# Patient Record
Sex: Male | Born: 2000 | Race: White | Hispanic: No | Marital: Single | Smoking: Never smoker
Health system: Southern US, Community
[De-identification: ages and names within clinical notes are randomized; demographics above are authoritative.]

---
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
## 2020-02-18 ENCOUNTER — Telehealth: Payer: Self-pay | Admitting: Physician Assistant

## 2020-02-18 ENCOUNTER — Other Ambulatory Visit: Payer: Self-pay

## 2020-02-18 DIAGNOSIS — R748 Abnormal levels of other serum enzymes: Secondary | ICD-10-CM

## 2020-02-18 NOTE — Telephone Encounter (Signed)
Thomasene Lot, DO  10/29/2019 12:24 PM EST    Please call patient let him know that his vitamin D is too low and I recommend he take 2000 IUs vitamin D3 over-the-counter daily.  Also, his liver enzymes are high and to my knowledge patient does not drink alcohol, take Tylenol on a regular basis or any other supplements that I know of.  Please let them know I recommend a recheck of this after 2 to 3 months of steering clear of all hepatotoxic substances.  If it anytime he has unusual symptoms, he needs to make a follow-up office visit with me to discuss.    Actually, the pt is to return for yearly physical 09/2020.  Pt needs repeat labs only, unless he is having any problems, especially with GI problems such as abdominal pain, nausea or vomiting, blood in stool, etc.  Orders entered for labs.  Tiajuana Amass, CMA

## 2020-02-18 NOTE — Telephone Encounter (Signed)
Pt's dad ask when are we going to bring pt back in for OV & labs ( last cancelled appt shows :  IN OFFICE APPT--OV to discuss elevated liver enzyme level- & 2nd HPV shot----but AVS from  2/11 OV states  CPE w/ FBW---- also there are no labs orders in pt's chart)  --Forwarding message to med asst for review w/provider on what visit type to scheduled pt for & I will contact parent to set up.  --please advise.  --glh

## 2020-02-28 ENCOUNTER — Other Ambulatory Visit: Payer: Self-pay

## 2020-02-28 ENCOUNTER — Other Ambulatory Visit: Payer: 59

## 2020-02-28 DIAGNOSIS — R748 Abnormal levels of other serum enzymes: Secondary | ICD-10-CM

## 2020-02-29 LAB — HEPATIC FUNCTION PANEL
ALT: 37 IU/L (ref 0–44)
AST: 24 IU/L (ref 0–40)
Albumin: 4.9 g/dL (ref 4.1–5.2)
Alkaline Phosphatase: 124 IU/L (ref 55–125)
Bilirubin Total: 0.9 mg/dL (ref 0.0–1.2)
Bilirubin, Direct: 0.18 mg/dL (ref 0.00–0.40)
Total Protein: 7.5 g/dL (ref 6.0–8.5)

## 2020-03-02 ENCOUNTER — Telehealth: Payer: Self-pay | Admitting: Physician Assistant

## 2020-03-02 NOTE — Telephone Encounter (Signed)
Patients father, Daelen Belvedere, in office requesting last lab results for patient. Arlys John is on DPR. AS, CMA

## 2020-03-10 ENCOUNTER — Ambulatory Visit (INDEPENDENT_AMBULATORY_CARE_PROVIDER_SITE_OTHER): Payer: 59 | Admitting: Physician Assistant

## 2020-03-10 ENCOUNTER — Encounter: Payer: Self-pay | Admitting: Physician Assistant

## 2020-03-10 ENCOUNTER — Other Ambulatory Visit: Payer: Self-pay

## 2020-03-10 VITALS — BP 131/64 | HR 83 | Temp 97.6°F | Ht 71.0 in | Wt 188.8 lb

## 2020-03-10 DIAGNOSIS — R748 Abnormal levels of other serum enzymes: Secondary | ICD-10-CM

## 2020-03-10 DIAGNOSIS — Z23 Encounter for immunization: Secondary | ICD-10-CM | POA: Diagnosis not present

## 2020-03-10 NOTE — Progress Notes (Signed)
Established Patient Office Visit  Subjective:  Patient ID: Jermaine Schmidt, male    DOB: 2001/01/10  Age: 19 y.o. MRN: [id number]  CC: No chief complaint on file.   HPI Jermaine Schmidt presents for follow-up on elevated liver enzymes. Most recent hepatic function resulted within normal limits. Patient states he was taking protein shakes which he stopped for about a week and then resumed, so doesn't think that was the cause since his liver enzymes normalized. Denies excessive Tylenol use, alcohol use, nausea, vomiting or abdominal pain. He is also due for his second HPV vaccine dose.  History reviewed. No pertinent past medical history.  History reviewed. No pertinent surgical history.  Family History  Problem Relation Age of Onset  . Breast cancer Paternal Grandmother     Social History   Socioeconomic History  . Marital status: Single    Spouse name: Not on file  . Number of children: Not on file  . Years of education: Not on file  . Highest education level: Not on file  Occupational History  . Not on file  Tobacco Use  . Smoking status: Never Smoker  . Smokeless tobacco: Never Used  Vaping Use  . Vaping Use: Never used  Substance and Sexual Activity  . Alcohol use: No  . Drug use: No  . Sexual activity: Never  Other Topics Concern  . Not on file  Social History Narrative  . Not on file   Social Determinants of Health   Financial Resource Strain:   . Difficulty of Paying Living Expenses:   Food Insecurity:   . Worried About Charity fundraiser in the Last Year:   . Arboriculturist in the Last Year:   Transportation Needs:   . Film/video editor (Medical):   Marland Kitchen Lack of Transportation (Non-Medical):   Physical Activity:   . Days of Exercise per Week:   . Minutes of Exercise per Session:   Stress:   . Feeling of Stress :   Social Connections:   . Frequency of Communication with Friends and Family:   . Frequency of Social Gatherings with Friends and  Family:   . Attends Religious Services:   . Active Member of Clubs or Organizations:   . Attends Archivist Meetings:   Marland Kitchen Marital Status:   Intimate Partner Violence:   . Fear of Current or Ex-Partner:   . Emotionally Abused:   Marland Kitchen Physically Abused:   . Sexually Abused:     No outpatient medications prior to visit.   No facility-administered medications prior to visit.    No Known Allergies  ROS Review of Systems Review of Systems: General:   Denies fever, chills, unexplained weight loss.  Optho/Auditory:   Denies visual changes, blurred vision/LOV Respiratory:   Denies SOB, DOE more than baseline levels.  Cardiovascular:   Denies chest pain, palpitations, new onset peripheral edema  Gastrointestinal:   Denies nausea, vomiting, diarrhea.  Genitourinary: Denies dysuria, freq/ urgency, flank pain   Endocrine:     Denies hot or cold intolerance, polyuria, polydipsia. Musculoskeletal:   Denies unexplained myalgias, joint swelling, unexplained arthralgias, gait problems.  Skin:  Denies rash, suspicious lesions Neurological:     Denies dizziness, unexplained weakness, numbness  Psychiatric/Behavioral:   Denies mood changes, suicidal or homicidal ideations, hallucinations     Objective:    Physical Exam General:  Well Developed, well nourished, appropriate for stated age.  Neuro:  Alert and oriented,  extra-ocular muscles intact  HEENT:  Normocephalic, atraumatic, neck supple Skin:  no gross rash, warm, pink. Cardiac:  RRR, S1 S2 Respiratory:  ECTA B/L and A/P, Not using accessory muscles, speaking in full sentences- unlabored. Vascular:  Ext warm, no cyanosis apprec.; cap RF less 2 sec. No edema present. Psych:  No HI/SI, judgement and insight good, Euthymic mood. Full Affect.  BP 131/64   Pulse 83   Temp 97.6 F (36.4 C) (Oral)   Ht 5\' 11"  (1.803 m)   Wt 188 lb 12.8 oz (85.6 kg)   SpO2 99%   BMI 26.33 kg/m  Wt Readings from Last 3 Encounters:  03/10/20  188 lb 12.8 oz (85.6 kg) (89 %, Z= 1.24)*  10/31/19 185 lb (83.9 kg) (88 %, Z= 1.19)*  10/16/19 187 lb 9.6 oz (85.1 kg) (90 %, Z= 1.26)*   * Growth percentiles are based on CDC (Boys, 2-20 Years) data.     Health Maintenance Due  Topic Date Due  . Hepatitis C Screening  Never done  . HIV Screening  Never done    There are no preventive care reminders to display for this patient.  Lab Results  Component Value Date   TSH 2.480 10/24/2019   Lab Results  Component Value Date   WBC 7.2 10/24/2019   HGB 17.0 10/24/2019   HCT 49.7 10/24/2019   MCV 85 10/24/2019   PLT 244 10/24/2019   Lab Results  Component Value Date   NA 142 10/24/2019   K 4.7 10/24/2019   CO2 22 10/24/2019   GLUCOSE 85 10/24/2019   BUN 20 10/24/2019   CREATININE 0.83 10/24/2019   BILITOT 0.9 02/28/2020   ALKPHOS 124 02/28/2020   AST 24 02/28/2020   ALT 37 02/28/2020   PROT 7.5 02/28/2020   ALBUMIN 4.9 02/28/2020   CALCIUM 10.1 10/24/2019   Lab Results  Component Value Date   CHOL 156 10/24/2019   Lab Results  Component Value Date   HDL 45 10/24/2019   Lab Results  Component Value Date   LDLCALC 98 10/24/2019   Lab Results  Component Value Date   TRIG 63 10/24/2019   Lab Results  Component Value Date   CHOLHDL 3.5 10/24/2019   Lab Results  Component Value Date   HGBA1C 5.3 10/24/2019      Assessment & Plan:   Problem List Items Addressed This Visit      Other   Elevated liver enzymes - Primary    Other Visit Diagnoses    Need for HPV vaccination       Relevant Orders   HPV 9-valent vaccine,Recombinat (Completed)     Elevated liver enzymes: - Liver enzymes have normalized and etiology is unclear. - Advised patient if starts developing any new symptoms such as abdominal pain, to RTC for further evaluation. - Will continue to monitor.   No orders of the defined types were placed in this encounter.   Follow-up: Return if symptoms worsen or fail to improve.    12/22/2019, PA-C
